# Patient Record
Sex: Female | Born: 1950 | Race: White | Hispanic: No | Marital: Married
Health system: Southern US, Community
[De-identification: ages and names within clinical notes are randomized; demographics above are authoritative.]

---

## 2004-03-18 ENCOUNTER — Ambulatory Visit: Payer: Self-pay | Admitting: Anesthesiology

## 2004-04-12 ENCOUNTER — Ambulatory Visit: Payer: Self-pay | Admitting: Anesthesiology

## 2004-05-19 ENCOUNTER — Ambulatory Visit: Payer: Self-pay | Admitting: Anesthesiology

## 2004-07-01 ENCOUNTER — Ambulatory Visit: Payer: Self-pay | Admitting: Anesthesiology

## 2004-07-26 ENCOUNTER — Ambulatory Visit: Payer: Self-pay | Admitting: Anesthesiology

## 2004-08-19 ENCOUNTER — Ambulatory Visit: Payer: Self-pay | Admitting: Anesthesiology

## 2004-09-30 ENCOUNTER — Ambulatory Visit: Payer: Self-pay | Admitting: Anesthesiology

## 2004-12-29 ENCOUNTER — Other Ambulatory Visit: Payer: Self-pay

## 2004-12-29 ENCOUNTER — Emergency Department: Payer: Self-pay | Admitting: Emergency Medicine

## 2006-11-14 ENCOUNTER — Ambulatory Visit: Payer: Self-pay | Admitting: Family Medicine

## 2007-10-08 ENCOUNTER — Emergency Department: Payer: Self-pay | Admitting: Emergency Medicine

## 2007-12-03 ENCOUNTER — Ambulatory Visit: Payer: Self-pay | Admitting: Family Medicine

## 2008-12-08 ENCOUNTER — Ambulatory Visit: Payer: Self-pay | Admitting: Family Medicine

## 2009-12-10 ENCOUNTER — Ambulatory Visit: Payer: Self-pay | Admitting: Family Medicine

## 2010-03-12 ENCOUNTER — Ambulatory Visit: Payer: Self-pay | Admitting: Internal Medicine

## 2011-06-24 ENCOUNTER — Inpatient Hospital Stay: Payer: Self-pay | Admitting: Internal Medicine

## 2011-06-24 LAB — URINALYSIS, COMPLETE
Bacteria: NONE SEEN
Bilirubin,UR: NEGATIVE
Blood: NEGATIVE
Glucose,UR: >=500 mg/dL (ref 0–75)
Leukocyte Esterase: NEGATIVE
Nitrite: NEGATIVE
RBC,UR: 1 /HPF (ref 0–5)
Squamous Epithelial: 1
WBC UR: 1 /HPF (ref 0–5)

## 2011-06-24 LAB — COMPREHENSIVE METABOLIC PANEL
Calcium, Total: 9.9 mg/dL (ref 8.5–10.1)
Chloride: 106 mmol/L (ref 98–107)
Creatinine: 1.48 mg/dL — ABNORMAL HIGH (ref 0.60–1.30)
EGFR (African American): 46 — ABNORMAL LOW
EGFR (Non-African Amer.): 38 — ABNORMAL LOW
Osmolality: 315 (ref 275–301)
Potassium: 3.6 mmol/L (ref 3.5–5.1)
SGOT(AST): 21 U/L (ref 15–37)
Sodium: 143 mmol/L (ref 136–145)
Total Protein: 8.2 g/dL (ref 6.4–8.2)

## 2011-06-24 LAB — BASIC METABOLIC PANEL
Anion Gap: 11 (ref 7–16)
BUN: 27 mg/dL — ABNORMAL HIGH (ref 7–18)
Co2: 24 mmol/L (ref 21–32)
Creatinine: 1.29 mg/dL (ref 0.60–1.30)
EGFR (African American): 54 — ABNORMAL LOW
EGFR (Non-African Amer.): 45 — ABNORMAL LOW
Glucose: 343 mg/dL — ABNORMAL HIGH (ref 65–99)
Potassium: 3.6 mmol/L (ref 3.5–5.1)
Sodium: 147 mmol/L — ABNORMAL HIGH (ref 136–145)

## 2011-06-24 LAB — CBC
HCT: 43 % (ref 35.0–47.0)
HGB: 14.9 g/dL (ref 12.0–16.0)
MCHC: 34.6 g/dL (ref 32.0–36.0)
MCV: 88 fL (ref 80–100)
Platelet: 301 10*3/uL (ref 150–440)
RBC: 4.87 10*6/uL (ref 3.80–5.20)

## 2011-06-24 LAB — DRUG SCREEN, URINE
Benzodiazepine, Ur Scrn: NEGATIVE (ref ?–200)
Cannabinoid 50 Ng, Ur ~~LOC~~: NEGATIVE (ref ?–50)
MDMA (Ecstasy)Ur Screen: NEGATIVE (ref ?–500)
Opiate, Ur Screen: NEGATIVE (ref ?–300)
Phencyclidine (PCP) Ur S: NEGATIVE (ref ?–25)

## 2011-06-24 LAB — TROPONIN I: Troponin-I: <0.02 ng/mL

## 2011-06-24 LAB — CK TOTAL AND CKMB (NOT AT ARMC)
CK, Total: 289 U/L — ABNORMAL HIGH (ref 21–215)
CK-MB: 1.2 ng/mL (ref 0.5–3.6)

## 2011-06-25 LAB — CBC WITH DIFFERENTIAL/PLATELET
Eosinophil %: 0.4 %
HCT: 39.1 % (ref 35.0–47.0)
HGB: 13.2 g/dL (ref 12.0–16.0)
Lymphocyte #: 3.9 10*3/uL — ABNORMAL HIGH (ref 1.0–3.6)
MCH: 30.1 pg (ref 26.0–34.0)
MCV: 90 fL (ref 80–100)
Monocyte #: 0.8 10*3/uL — ABNORMAL HIGH (ref 0.0–0.7)
Neutrophil #: 7.7 10*3/uL — ABNORMAL HIGH (ref 1.4–6.5)
Platelet: 260 10*3/uL (ref 150–440)
RBC: 4.37 10*6/uL (ref 3.80–5.20)
RDW: 12.8 % (ref 11.5–14.5)
WBC: 12.4 10*3/uL — ABNORMAL HIGH (ref 3.6–11.0)

## 2011-06-25 LAB — BASIC METABOLIC PANEL
Anion Gap: 10 (ref 7–16)
Anion Gap: 10 (ref 7–16)
Calcium, Total: 8.9 mg/dL (ref 8.5–10.1)
Calcium, Total: 8.7 mg/dL (ref 8.5–10.1)
Chloride: 115 mmol/L — ABNORMAL HIGH (ref 98–107)
Creatinine: 1.08 mg/dL (ref 0.60–1.30)
Creatinine: 0.99 mg/dL (ref 0.60–1.30)
EGFR (African American): >60
EGFR (African American): >60
EGFR (Non-African Amer.): 55 — ABNORMAL LOW
EGFR (Non-African Amer.): >60
Glucose: 140 mg/dL — ABNORMAL HIGH (ref 65–99)
Glucose: 146 mg/dL — ABNORMAL HIGH (ref 65–99)
Osmolality: 308 (ref 275–301)
Potassium: 3.4 mmol/L — ABNORMAL LOW (ref 3.5–5.1)
Potassium: 3.3 mmol/L — ABNORMAL LOW (ref 3.5–5.1)
Sodium: 152 mmol/L — ABNORMAL HIGH (ref 136–145)
Sodium: 150 mmol/L — ABNORMAL HIGH (ref 136–145)

## 2011-06-25 LAB — TSH: Thyroid Stimulating Horm: 3.05 u[IU]/mL

## 2011-06-26 LAB — BASIC METABOLIC PANEL
Anion Gap: 12 (ref 7–16)
BUN: 17 mg/dL (ref 7–18)
Calcium, Total: 8.3 mg/dL — ABNORMAL LOW (ref 8.5–10.1)
Chloride: 106 mmol/L (ref 98–107)
EGFR (African American): >60
EGFR (Non-African Amer.): >60
Osmolality: 293 (ref 275–301)
Potassium: 2.8 mmol/L — ABNORMAL LOW (ref 3.5–5.1)

## 2011-06-26 LAB — WBC: WBC: 10.8 10*3/uL (ref 3.6–11.0)

## 2011-06-26 LAB — MAGNESIUM: Magnesium: 1.7 mg/dL — ABNORMAL LOW

## 2011-06-27 LAB — POTASSIUM: Potassium: 3.4 mmol/L — ABNORMAL LOW (ref 3.5–5.1)

## 2011-06-28 LAB — HEMOGLOBIN: HGB: 12.6 g/dL (ref 12.0–16.0)

## 2011-06-28 LAB — BASIC METABOLIC PANEL
Anion Gap: 10 (ref 7–16)
BUN: 13 mg/dL (ref 7–18)
Calcium, Total: 9.2 mg/dL (ref 8.5–10.1)
Chloride: 106 mmol/L (ref 98–107)
Co2: 24 mmol/L (ref 21–32)
Osmolality: 285 (ref 275–301)
Potassium: 3.7 mmol/L (ref 3.5–5.1)

## 2011-06-28 LAB — MAGNESIUM: Magnesium: 1.8 mg/dL

## 2011-06-30 LAB — CULTURE, BLOOD (SINGLE)

## 2011-07-08 ENCOUNTER — Inpatient Hospital Stay: Payer: Self-pay | Admitting: Internal Medicine

## 2011-07-08 LAB — URINALYSIS, COMPLETE
Bacteria: NONE SEEN
Bilirubin,UR: NEGATIVE
Blood: NEGATIVE
Glucose,UR: NEGATIVE mg/dL (ref 0–75)
Ph: 7 (ref 4.5–8.0)
Protein: NEGATIVE
RBC,UR: 2 /HPF (ref 0–5)
Squamous Epithelial: 1

## 2011-07-08 LAB — BASIC METABOLIC PANEL
Anion Gap: 14 (ref 7–16)
BUN: 13 mg/dL (ref 7–18)
Calcium, Total: 9.3 mg/dL (ref 8.5–10.1)
Chloride: 108 mmol/L — ABNORMAL HIGH (ref 98–107)
Creatinine: 1.14 mg/dL (ref 0.60–1.30)
EGFR (African American): >60
EGFR (Non-African Amer.): 52 — ABNORMAL LOW
Glucose: 122 mg/dL — ABNORMAL HIGH (ref 65–99)
Osmolality: 286 (ref 275–301)
Potassium: 3.3 mmol/L — ABNORMAL LOW (ref 3.5–5.1)
Sodium: 143 mmol/L (ref 136–145)

## 2011-07-08 LAB — RAPID INFLUENZA A&B ANTIGENS

## 2011-07-08 LAB — CBC
HGB: 13 g/dL (ref 12.0–16.0)
MCH: 30 pg (ref 26.0–34.0)
MCV: 88 fL (ref 80–100)
RBC: 4.32 10*6/uL (ref 3.80–5.20)
WBC: 10.8 10*3/uL (ref 3.6–11.0)

## 2011-07-09 LAB — CBC WITH DIFFERENTIAL/PLATELET
Basophil #: 0.1 10*3/uL (ref 0.0–0.1)
Basophil %: 0.7 %
Eosinophil #: 0.2 10*3/uL (ref 0.0–0.7)
Eosinophil %: 2.5 %
HGB: 12.4 g/dL (ref 12.0–16.0)
Lymphocyte #: 3.1 10*3/uL (ref 1.0–3.6)
Lymphocyte %: 32.5 %
MCH: 30 pg (ref 26.0–34.0)
MCHC: 33.7 g/dL (ref 32.0–36.0)
Monocyte %: 7.3 %
Neutrophil #: 5.4 10*3/uL (ref 1.4–6.5)
Neutrophil %: 57 %
RDW: 13.3 % (ref 11.5–14.5)
WBC: 9.5 10*3/uL (ref 3.6–11.0)

## 2011-07-09 LAB — BASIC METABOLIC PANEL
Anion Gap: 11 (ref 7–16)
BUN: 14 mg/dL (ref 7–18)
Calcium, Total: 8.8 mg/dL (ref 8.5–10.1)
Chloride: 111 mmol/L — ABNORMAL HIGH (ref 98–107)
Creatinine: 1.19 mg/dL (ref 0.60–1.30)
EGFR (African American): 60 — ABNORMAL LOW
EGFR (Non-African Amer.): 49 — ABNORMAL LOW
Glucose: 86 mg/dL (ref 65–99)
Osmolality: 290 (ref 275–301)
Sodium: 146 mmol/L — ABNORMAL HIGH (ref 136–145)

## 2011-07-10 LAB — URINE CULTURE

## 2011-07-13 LAB — CULTURE, BLOOD (SINGLE)

## 2011-07-13 LAB — CREATININE, SERUM
Creatinine: 0.86 mg/dL (ref 0.60–1.30)
EGFR (Non-African Amer.): >60

## 2011-08-10 ENCOUNTER — Observation Stay: Payer: Self-pay | Admitting: Internal Medicine

## 2011-08-10 LAB — COMPREHENSIVE METABOLIC PANEL
Anion Gap: 14 (ref 7–16)
BUN: 13 mg/dL (ref 7–18)
Bilirubin,Total: 0.3 mg/dL (ref 0.2–1.0)
Creatinine: 1.08 mg/dL (ref 0.60–1.30)
EGFR (African American): >60
Potassium: 3.2 mmol/L — ABNORMAL LOW (ref 3.5–5.1)
SGOT(AST): 31 U/L (ref 15–37)
SGPT (ALT): 40 U/L
Total Protein: 7.3 g/dL (ref 6.4–8.2)

## 2011-08-10 LAB — CBC
MCH: 29.9 pg (ref 26.0–34.0)
MCV: 88 fL (ref 80–100)
Platelet: 286 10*3/uL (ref 150–440)
RDW: 13 % (ref 11.5–14.5)
WBC: 11.4 10*3/uL — ABNORMAL HIGH (ref 3.6–11.0)

## 2011-08-10 LAB — TROPONIN I: Troponin-I: <0.02 ng/mL

## 2012-06-24 ENCOUNTER — Emergency Department: Payer: Self-pay | Admitting: Internal Medicine

## 2012-06-24 LAB — COMPREHENSIVE METABOLIC PANEL
Albumin: 3.4 g/dL (ref 3.4–5.0)
Alkaline Phosphatase: 39 U/L — ABNORMAL LOW (ref 50–136)
Anion Gap: 12 (ref 7–16)
Bilirubin,Total: 0.3 mg/dL (ref 0.2–1.0)
Calcium, Total: 8.8 mg/dL (ref 8.5–10.1)
Chloride: 103 mmol/L (ref 98–107)
Co2: 23 mmol/L (ref 21–32)
EGFR (African American): >60
Glucose: 163 mg/dL — ABNORMAL HIGH (ref 65–99)
Osmolality: 282 (ref 275–301)
Potassium: 3.4 mmol/L — ABNORMAL LOW (ref 3.5–5.1)
Total Protein: 7.3 g/dL (ref 6.4–8.2)

## 2012-06-24 LAB — URINALYSIS, COMPLETE
Bilirubin,UR: NEGATIVE
Blood: NEGATIVE
Glucose,UR: NEGATIVE mg/dL (ref 0–75)
Ketone: NEGATIVE
Nitrite: NEGATIVE
Protein: 30
Squamous Epithelial: 10
WBC UR: 226 /HPF (ref 0–5)

## 2012-06-24 LAB — CBC
HCT: 35.8 % (ref 35.0–47.0)
MCH: 26.9 pg (ref 26.0–34.0)
MCV: 84 fL (ref 80–100)
Platelet: 331 10*3/uL (ref 150–440)
RBC: 4.25 10*6/uL (ref 3.80–5.20)
RDW: 13 % (ref 11.5–14.5)

## 2012-06-26 LAB — URINE CULTURE

## 2012-06-30 LAB — CULTURE, BLOOD (SINGLE)

## 2012-07-17 IMAGING — CT CT HEAD WITHOUT CONTRAST
2 series · 15 of 30 positions shown, 19 images · non-contrast
Comparison: none

REASON FOR EXAM: new onset seizure
COMMENTS:

[Series 2: without · axial · non-contrast · 0.39mm/px · z∈[+410,+540]mm · 13 of 39 slices shown, 17 images]
[im 3/39  brain]
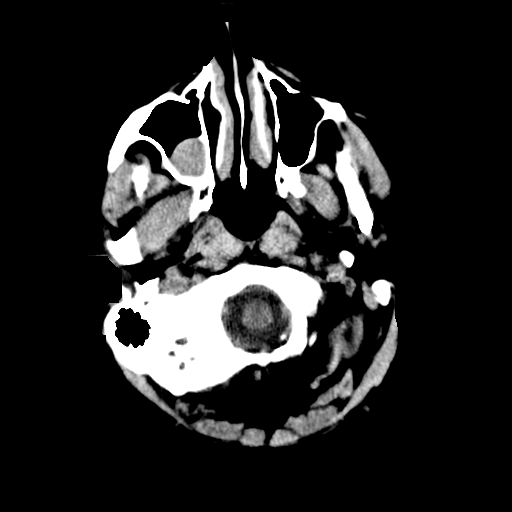
[im 3/39  bone]
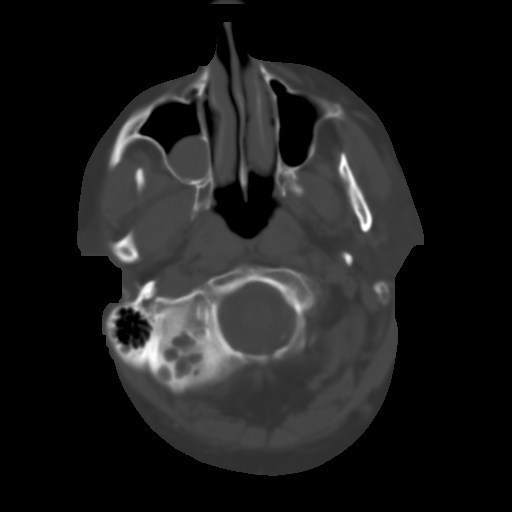
[im 6/39  brain]
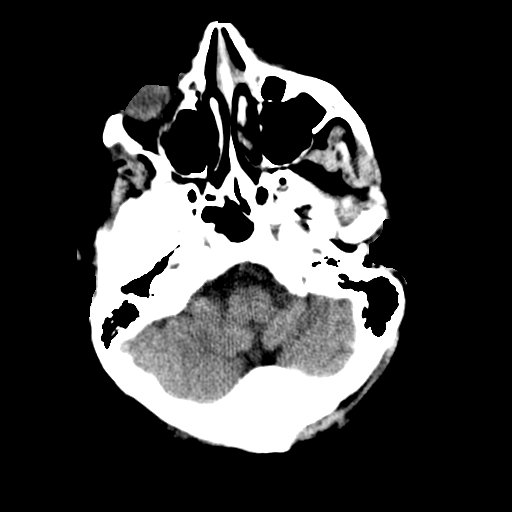
[im 9/39  brain]
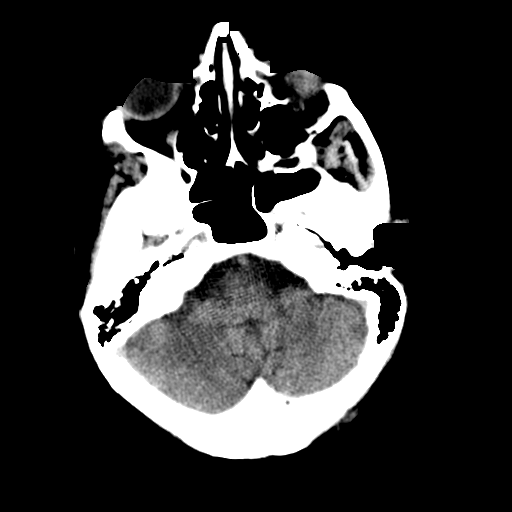
[im 11/39  brain]
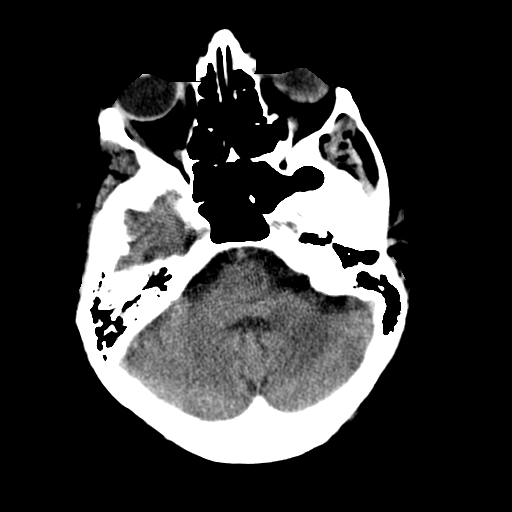
[im 14/39  brain]
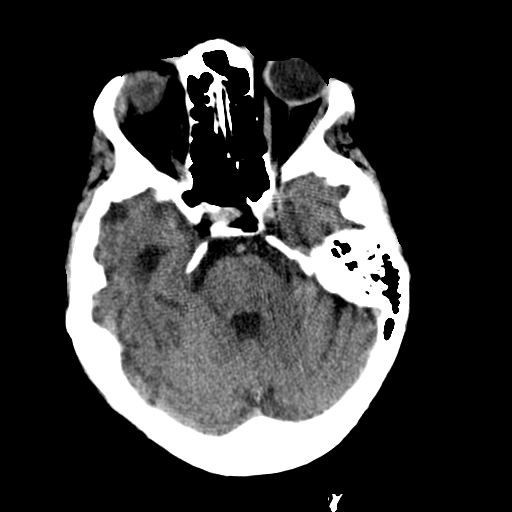
[im 14/39  bone]
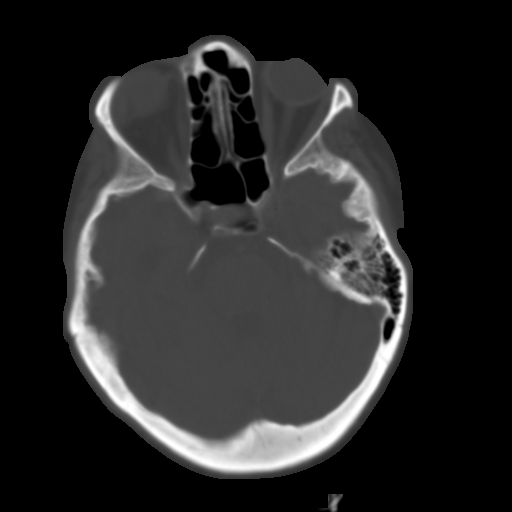
[im 17/39  brain]
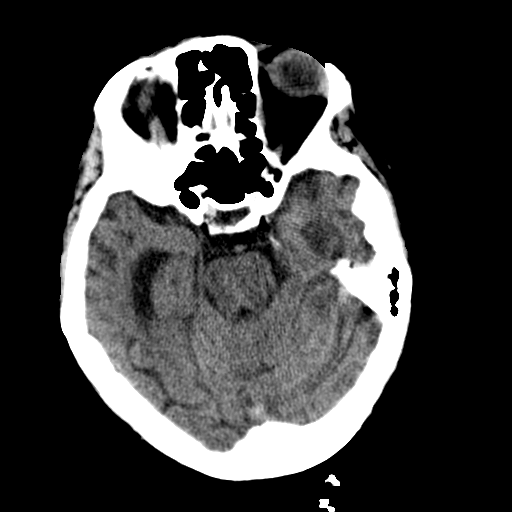
[im 20/39  brain]
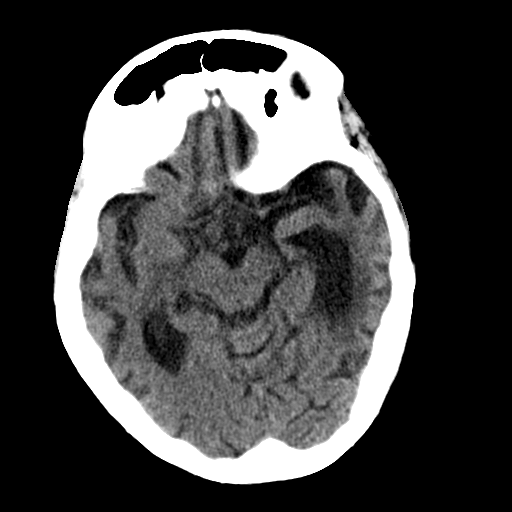
[im 22/39  brain]
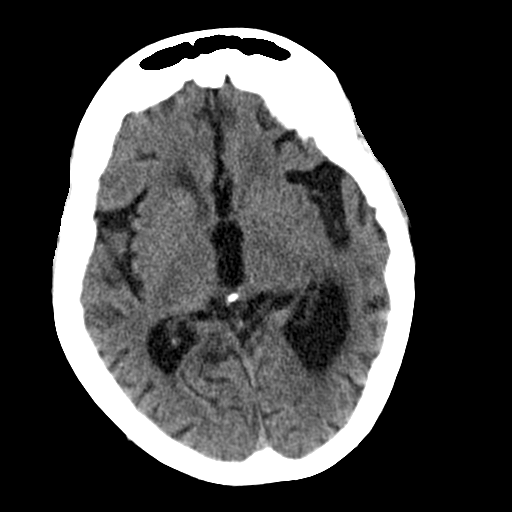
[im 25/39  brain]
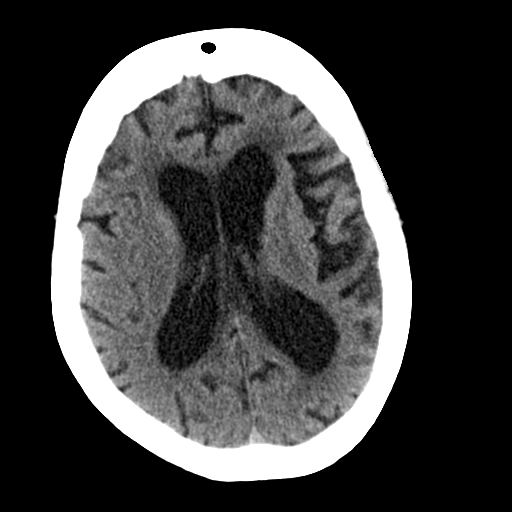
[im 25/39  bone]
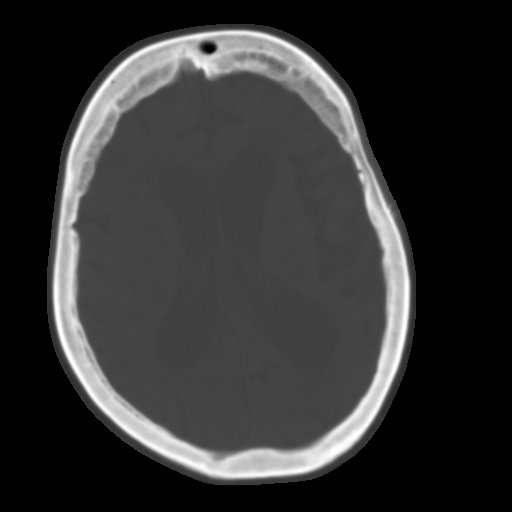
[im 28/39  brain]
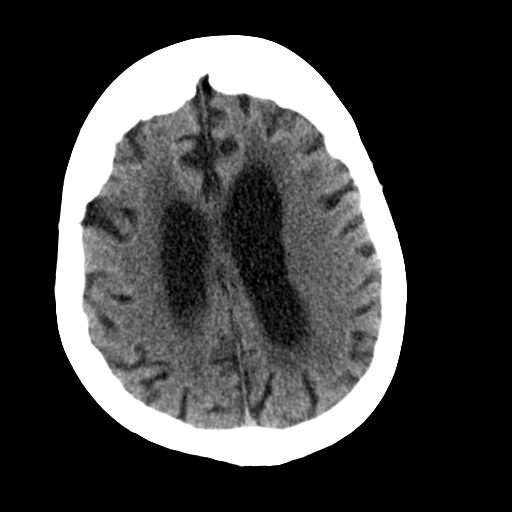
[im 30/39  brain]
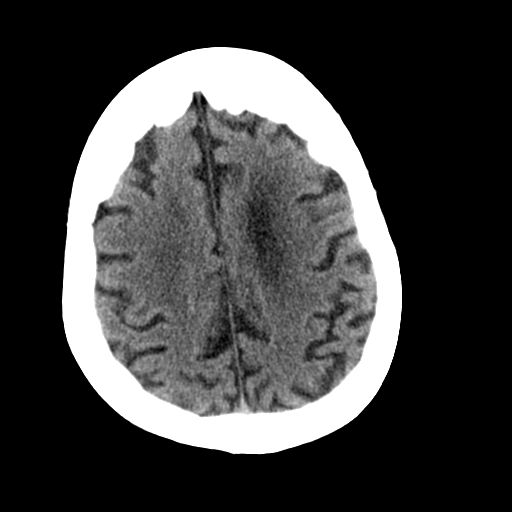
[im 33/39  brain]
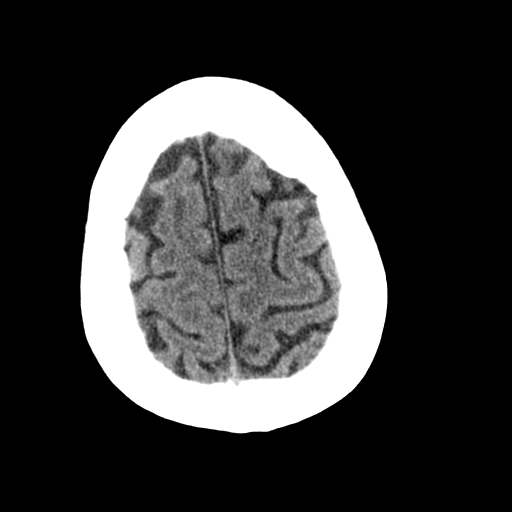
[im 36/39  brain]
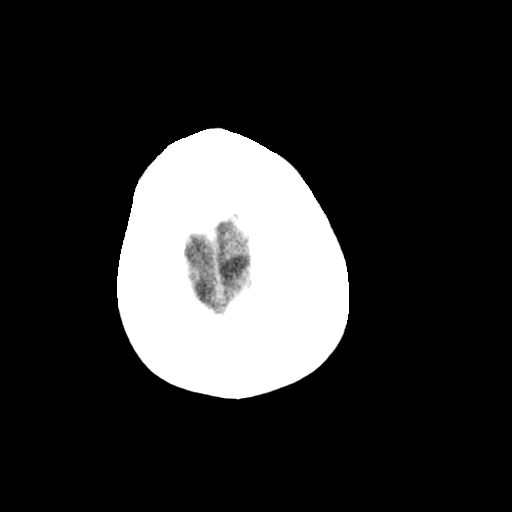
[im 36/39  bone]
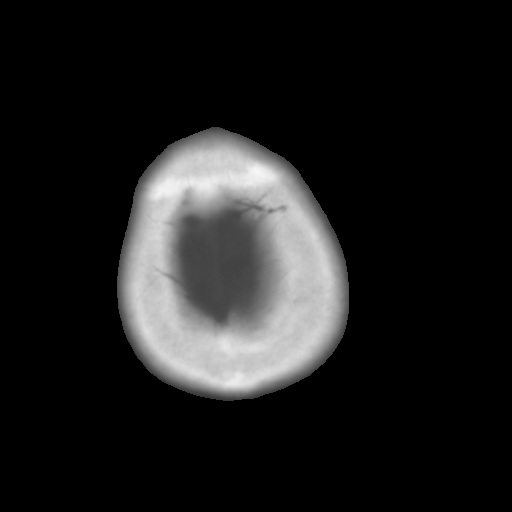

[Series 3: bone · axial · 0.39mm/px · z∈[+410,+430]mm · 2 of 39 slices shown]
[im 3/39  bone]
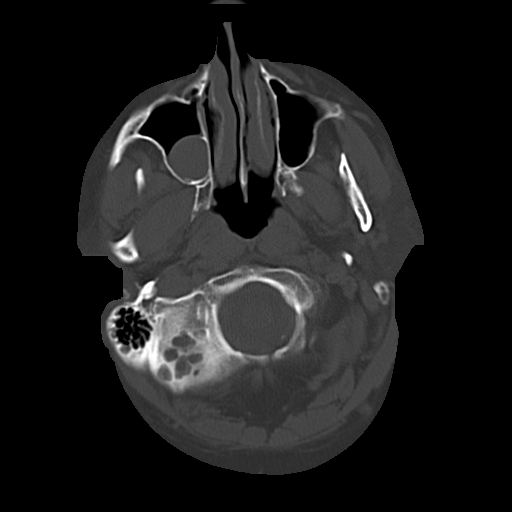
[im 9/39  bone]
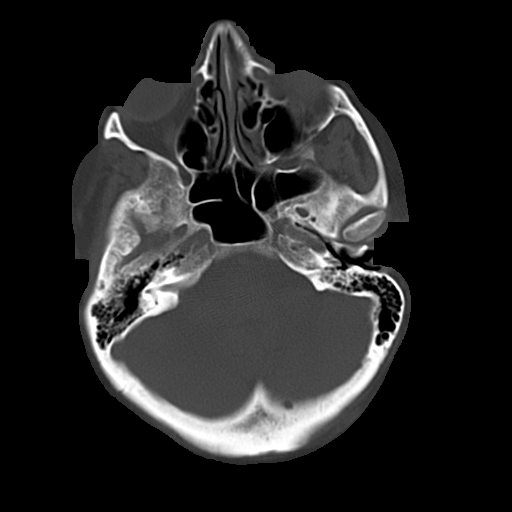

[15 of 30 positions shown; findings below may reference images not displayed]

PROCEDURE:     CT  - CT HEAD WITHOUT CONTRAST  - August 10, 2011  [DATE]

RESULT:     Axial noncontrast CT scanning was performed through the brain
with reconstructions at 5 mm intervals and slice thicknesses. Comparison
made to study June 24, 2011.

There is mild diffuse cerebral atrophy with compensatory ventriculomegaly.
These findings are stable. There is no shift of the midline. There is no
intracranial hemorrhage. There is no evidence of an evolving ischemic
infarction. The cerebellum and brainstem are normal in density.

At bone window settings there is likely a retention cyst or polyp in the
posterior aspect of the right maxillary sinus. This is stable. I see no
acute skull fracture.
IMPRESSION: There are chronic findings within the brain which appear
stable. I do not see evidence of an acute ischemic or hemorrhagic event nor
intracranial mass effect. Overall there has not been significant interval
change since the earlier study.

A preliminary report was sent to the [HOSPITAL] the conclusion
of the study.

## 2012-09-11 DEATH — deceased

## 2013-06-01 IMAGING — CT CT HEAD WITHOUT CONTRAST
1 of 2 series · 13 of 30 positions shown, 17 images · non-contrast
Comparison: none

REASON FOR EXAM: seizure
COMMENTS:

[Series 4: soft tissue · axial · 0.42mm/px · z∈[-169,-39]mm · 13 of 32 slices shown, 17 images]
[im 3/32  brain]
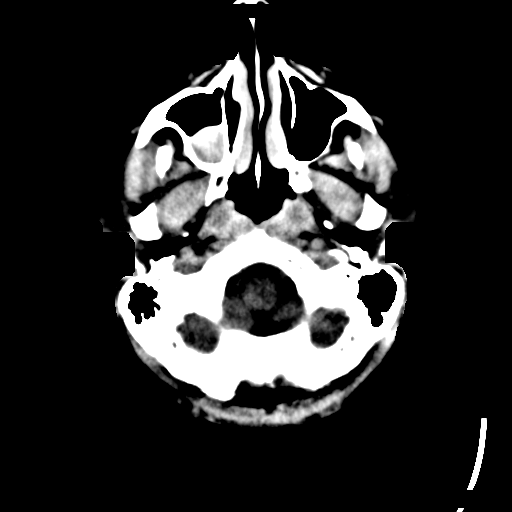
[im 3/32  bone]
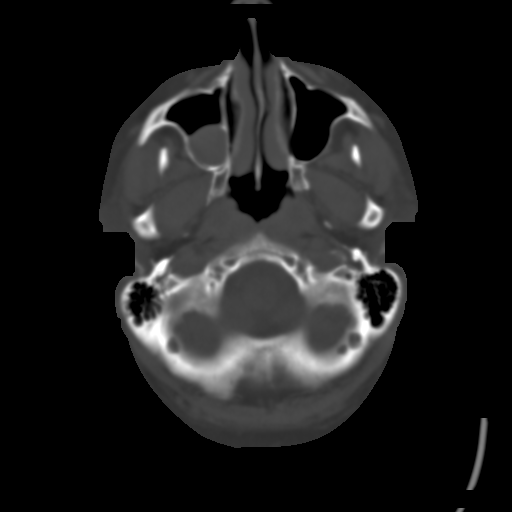
[im 5/32  brain]
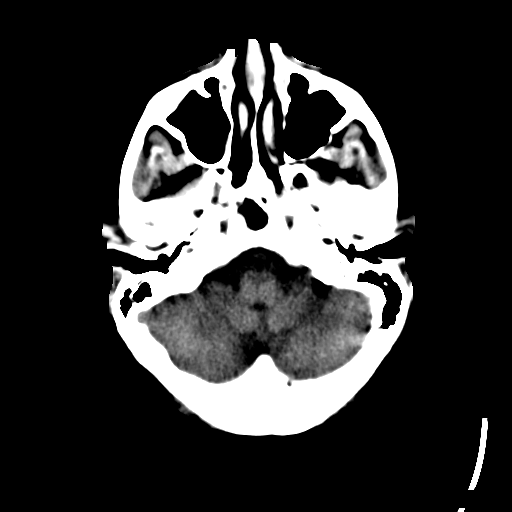
[im 7/32  brain]
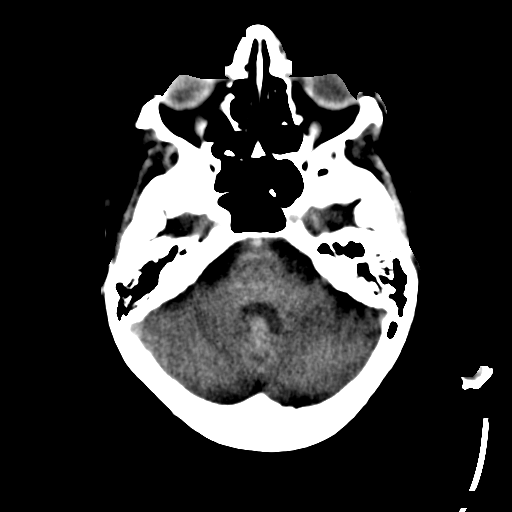
[im 9/32  brain]
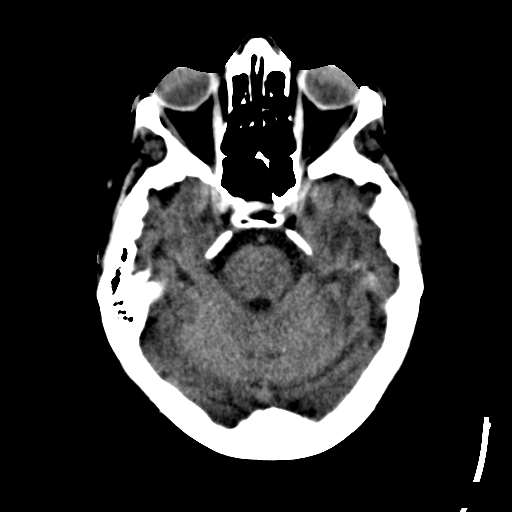
[im 12/32  brain]
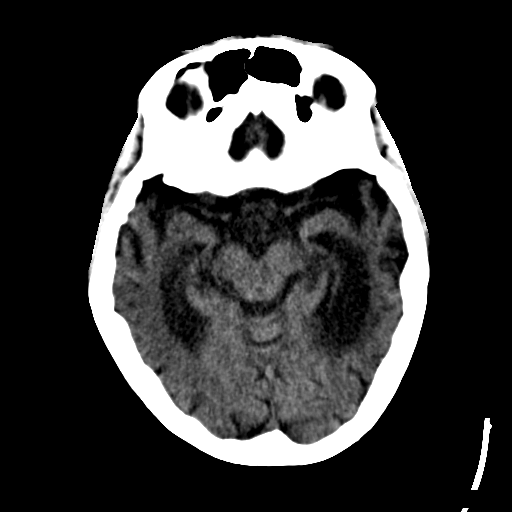
[im 12/32  bone]
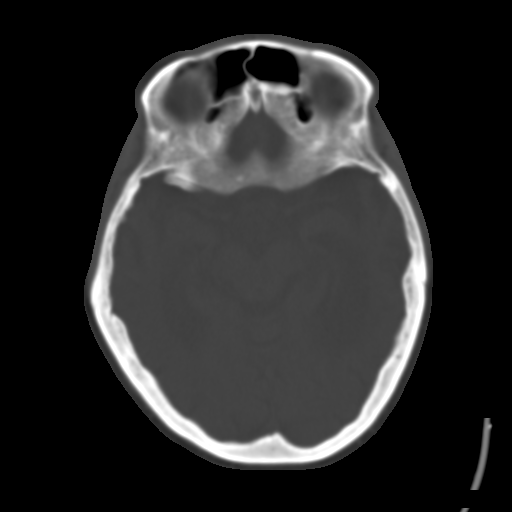
[im 14/32  brain]
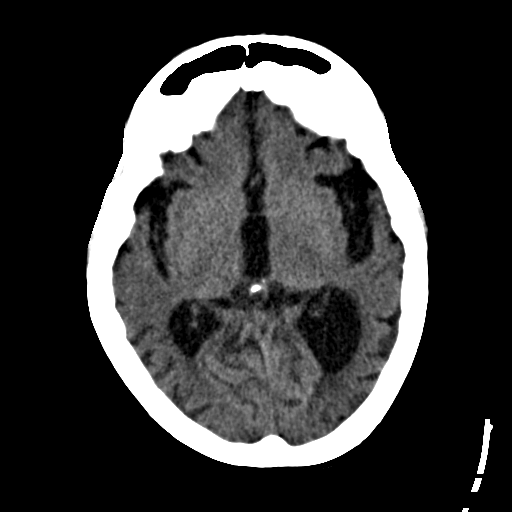
[im 16/32  brain]
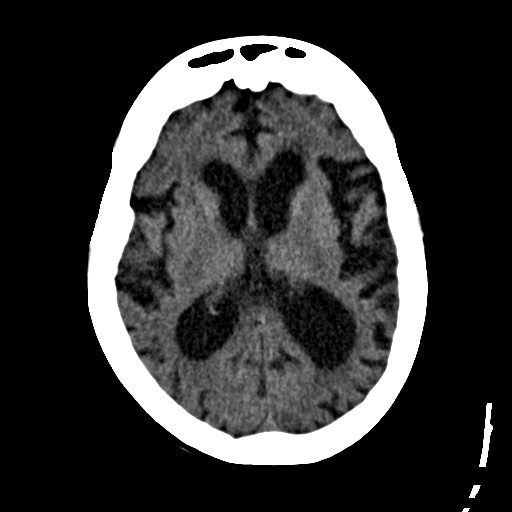
[im 18/32  brain]
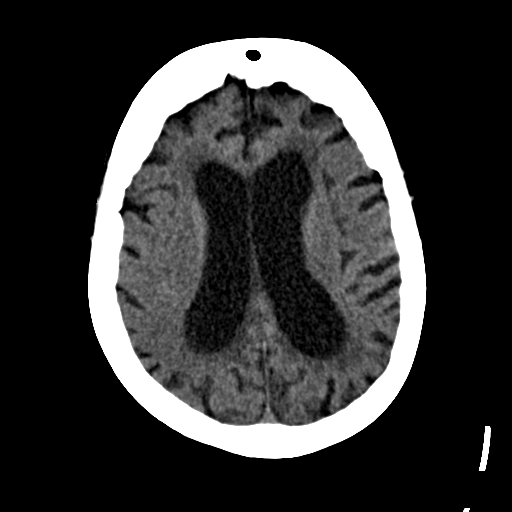
[im 20/32  brain]
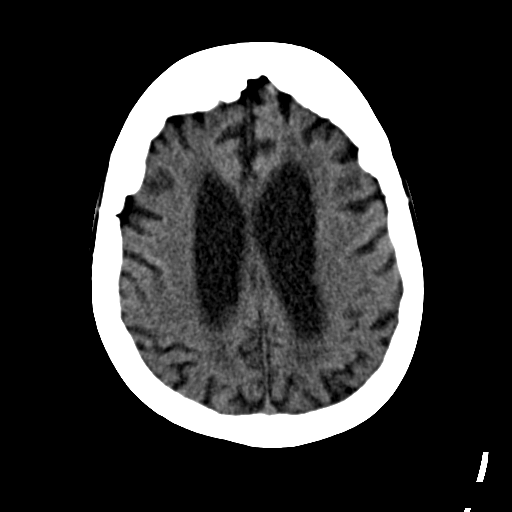
[im 20/32  bone]
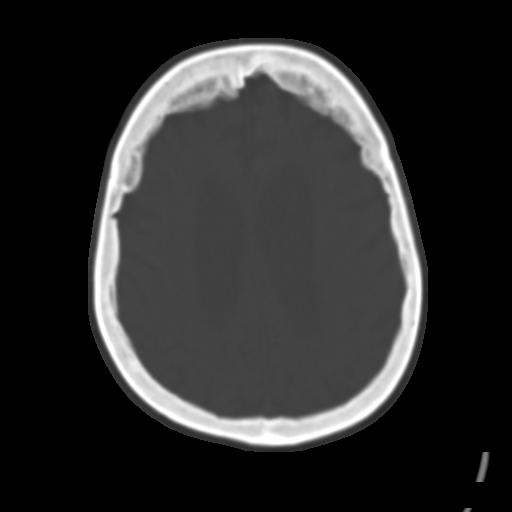
[im 23/32  brain]
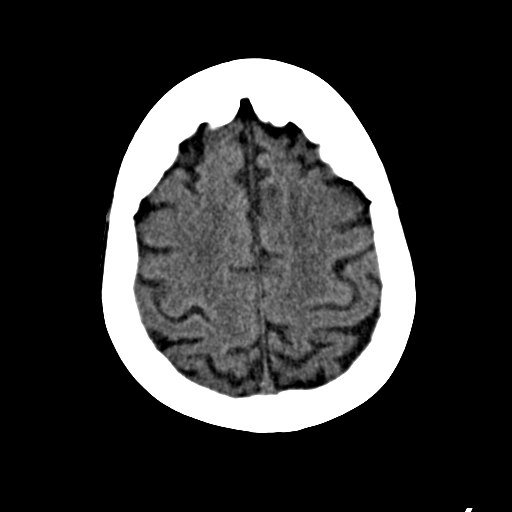
[im 25/32  brain]
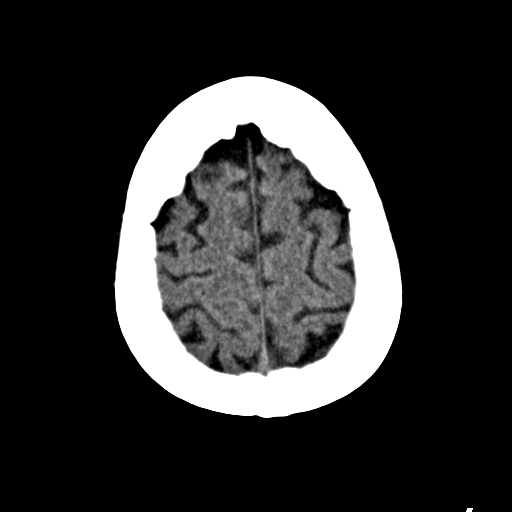
[im 27/32  brain]
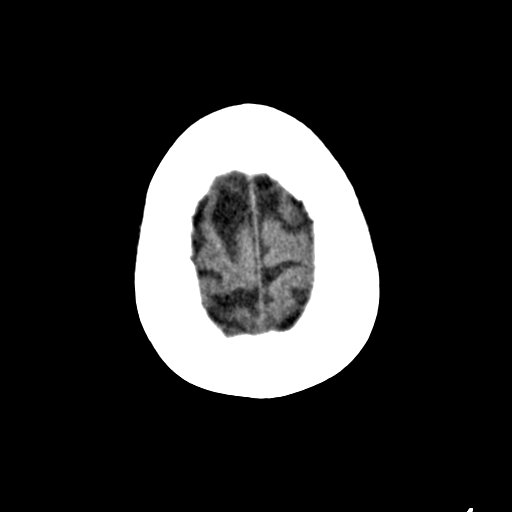
[im 29/32  brain]
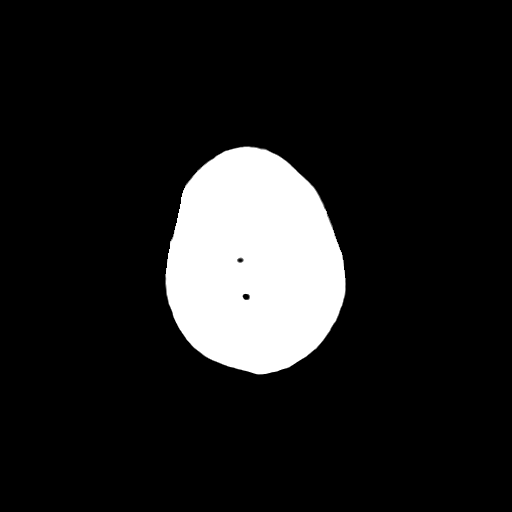
[im 29/32  bone]
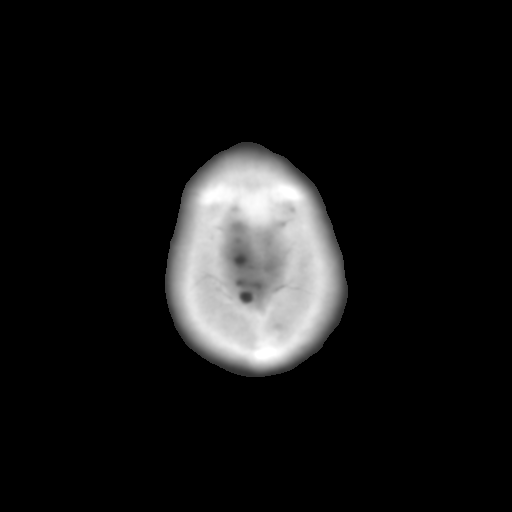

[13 of 30 positions shown; findings below may reference images not displayed]

PROCEDURE:     CT  - CT HEAD WITHOUT CONTRAST  - June 24, 2012  [DATE]

RESULT:     Axial CT scanning was performed through the brain with
reconstructions at 5 mm intervals and slice thicknesses. Comparison is made
to a study dated August 10, 2011.

There is moderate diffuse cerebral and cerebellar atrophy with compensatory
ventriculomegaly. There is no intracranial hemorrhage nor intracranial mass
effect. There is no evidence of an evolving ischemic infarction. There are
no abnormal intracranial calcifications. The cerebellum and brainstem are
normal in density. At bone window settings the observed portions of the
paranasal sinuses exhibit no air-fluid levels. There is a stable retention
cyst or polyp posteriorly in the right maxillary sinus.
IMPRESSION: 1. There is moderate diffuse cerebral and cerebellar atrophy with
compensatory ventriculomegaly which appears stable since [REDACTED] and
July 2011 CT scans.
2. There is no evidence of an acute ischemic or hemorrhagic infarction.
3. There is a stable retention cyst or polyp in the right maxillary sinus.

[REDACTED]

## 2014-10-05 NOTE — H&P (Signed)
PATIENT NAME:  Krystal GaulMARTIN, Krystal Ruiz MR#:  [id number] DATE OF BIRTH:  1950/09/13  DATE OF ADMISSION:  06/24/2011  ADDENDUM   I discussed the case with Dr. Leavy CellaBlocker, infection disease specialist on call, and consider that we have a low suspicion for meningitis and we have another cause of her symptoms being new onset diabetes, hyperglycemic, hyperosmolar state, with dehydration, she has no photophobia at this time. There is no flexion of her legs on neck flexion, so we are not going to continue antibiotics on her or meningitis precautions on her at this time. We will treat her diabetes aggressively, replace her fluids and if she gets better, then this is not meningitis. If there is any worsening of the situation, then we may have to consider restarting the antibiotic and ID can be consulted. But at this time, will not continue the antibiotics. She does not look like a bacterial meningitis. .   ____________________________ Fredia SorrowAbhinav Cierah Crader, MD ag:ap D: 06/24/2011 16:27:30 ET T: 06/24/2011 16:51:39 ET JOB#: [id number]  cc: Fredia SorrowAbhinav Yuval Nolet, MD, <Dictator> Heidi Dachebecca O. Darliss Cheneyrendorff, MD Fredia SorrowABHINAV Jenayah Antu MD ELECTRONICALLY SIGNED 07/08/2011 10:59

## 2014-10-05 NOTE — Discharge Summary (Signed)
PATIENT NAME:  Krystal Ruiz, Krystal Ruiz MR#:  [id number] DATE OF BIRTH:  10/11/1950  DATE OF ADMISSION:  06/24/2011 DATE OF DISCHARGE:  06/30/2011  ADMITTING DIAGNOSIS: Diabetes mellitus.    DISCHARGE DIAGNOSES:  1. Diabetes mellitus, type II, insulin dependent now with hemoglobin A1c 11.1 presenting with severe hyperglycemia as well as hyperosmolar state.  2. Acute renal failure, resolved.  3. Hypernatremia due to dehydration, resolved with IV fluids.  4. Dehydration.  5. Leukocytosis, likely stress related, resolved.  6. Hypokalemia and hypomagnesemia, resolved.  7. Generalized weakness, likely current disease/illness related, improving with physical therapy.  8. History of frontotemporal dementia.  9. History of hypothyroidism. 10. History of hypertension. 11. History of hyperlipidemia.  12. Obesity.   DISCHARGE CONDITION: Stable.   DISCHARGE MEDICATIONS: The patient is to resume her outpatient medications which are:  1. Aspirin 81 mg p.o. daily.  2. Cholecalciferol 2000 units p.o. daily.  3. Donepezil 10 mg p.o. at bedtime.  4. Glucosamine 1 tablet once daily. 5. Levothyroxine 25 mcg p.o. daily.  6. Memantine 10 mg p.o. twice daily.  7. Nystatin 100,000 units topical cream one application 2 or 3 times daily as needed.  8. Omega-3 polyunsaturated fatty acids 1 gram once every second day.  9. Paroxetine 20 mg p.o. daily.  10. Tramadol 50 mg 1 to 2 tablets every six hours as needed.  11. Rizatriptan 10 mg p.o. as needed for headache. May repeat every two hours until maximum of three tablets in 24 hours.  12. Multivitamins once daily.   ADDITIONAL MEDICATIONS:  1. Tylenol 350 mg p.o. every four hours as needed.  2. Glulisine sliding scale.  3. Glulisine 8 units 3 times daily subcutaneously with meals.  4. Magnesium oxide 400 mg p.o. daily.  5. Glucophage 1 gram p.o. twice daily.  6. Insulin Lantus 70 units subcutaneously daily.   DO NOT TAKE: The patient is not to take  lisinopril/HCTZ until recommended by primary care physician or facility MD.   DIET: 2 gram salt, 1800 ADA, mechanical soft, low fat, low cholesterol diet.   PHYSICAL ACTIVITY LIMITATIONS: As tolerated.   REFERRALS:  1. Physical therapy 2 to 7 times a week.  2. Also, the patient and husband should undergo diabetic education.  FOLLOW-UP: Follow-up appointment with Dr. Darliss Cheneyrendorff in two days after discharge.  CONSULTANTS: None.  RADIOLOGIC STUDIES:  1. Chest, portable, single view, 06/24/2011 no acute changes identified.  2. CT of head without contrast 06/24/2011 no CT evidence of acute intracranial abnormality. Changes of atrophy and chronic small vessel ischemic disease are present. 3. CT of cervical spine without contrast 06/24/2011 revealed no acute cervical spine bony abnormality.   REASON FOR ADMISSION: The patient is a 64 year old Caucasian female with past medical history significant for history of dementia who presented the hospital after falls. Apparently the patient has been falling a lot over the past one week prior to coming to the hospital. She was also having excessive polyuria. She was noted to have low-grade fevers at home apparently but fever was not documented in triage. On arrival to the Emergency Room, the patient was noted to be hyperglycemic in acute renal failure, severely dehydrated, and the patient was admitted to the hospital for further evaluation.   LABORATORY DATA: Glucose 516, BUN and creatinine 32 and 1.48. BMP was otherwise unremarkable. Estimated GFR for non-African American would be 38. Hemoglobin A1c was checked and was found to be 11.1. The patient's liver enzymes were unremarkable. The patient's cardiac enzymes were unremarkable. TSH  normal at 3.05. Urine drug screen was unremarkable. The patient's white blood cell count was slightly elevated to 11.1, hemoglobin 14.9, platelets 301. Blood cultures taken on 06/24/2011 showed no growth. Urine culture also showed  no growth. Influenza test was negative. Urinalysis was remarkable only for glucosuria, otherwise unremarkable. The patient had venous pH checked and was found to be 7.44. PCO2 was low at 32. EKG revealed sinus rhythm at 85 beats per minute, normal axis, prolonged QTc to 504 ms and changes concerning for possible anterior infarct, age undetermined. No acute ST-T changes, however, were noted. The patient's chest x-ray was unremarkable as well as her CT of head and neck.   HOSPITAL COURSE: The patient was admitted to the hospital for further evaluation. Initially she was admitted to Intensive Care Unit for IV insulin drip. The patient was continued on IV insulin drip and then later switched to insulin Lantus. She was rehydrated as well. However, with rehydration she was noted to have elevated sodium level to 152 on 06/25/2011. Her IV fluids where changed and she was allowed to drink free water. With this therapy, she improved and her sodium level normalized on 06/26/2011. Her diabetic medications were advanced introducing high doses of insulin as well as metformin. On day of discharge, the patient's blood glucose levels were ranging from 122 to 185, staying around 150. The patient's diabetic medications will be advanced tonight even higher to 70 units subcutaneously daily of Lantus as well as 1 gram of metformin twice a day. The patient as well as her husband is to receive diabetic education and lifestyle management as well as dietary education.   While in the hospital, the patient was also, as mentioned above, noted to be not only hypernatremic and very dehydrated but also had very low potassium levels. With rehydration, the patient's potassium level dropped down as low as 2.8 on 06/26/2011. Her magnesium level was also low at 1.7. The patient received potassium as well as magnesium replenishment via IV as well as oral pills. By the day of discharge, 06/30/2011, the patient's potassium level was normal at 3.7.    In regards to acute renal failure, initially on arrival to the Emergency Room the patient's creatinine was elevated to 1.48. The patient was rehydrated as mentioned above and the patient's creatinine level normalized on 06/28/2011. The patient's creatinine was 0.9. It is recommended to follow the patient's creatinine levels and make decisions about medication or hydration as necessary.   The patient was noted to have mild leukocytosis while in the hospital on arrival to the Emergency Room, however, as the patient's blood cultures were negative and her chest x-ray was unremarkable. She did not exhibit anymore signs of infection and she was afebrile all her stay in the hospital time. It was felt that the patient's leukocytosis was very likely stress related. Her leukocytosis, however, resolved by the time of discharge.   The patient was noted to have severe weakness. It was felt that the patient's weakness could have been related to current illness. Physical therapist worked with her and initially the patient was not able to do much, however, as time progressed she progressively was getting better. It was felt that the patient would benefit from discharge to skilled nursing facility for rehabilitation and Social Worker was consulted. However, because of insurance issues we were not able to discharge her earlier; however, today on 06/30/2011 we were allowed to transfer the patient to skilled nursing facility for rehabilitation where she will be undergoing physical  therapy.  For history of frontotemporal dementia as well as hypothyroidism, hypertension, as well as hyperlipidemia, the patient is to continue her outpatient medications. No changes were made in regards to that.  The patient is being discharged in stable condition with above-mentioned medications and follow-up.  Of note, in regards to hypertension issues, the patient's blood pressure remained somewhat on the low side during her stay in the  hospital time and the patient's blood pressure medications were placed on hold. By day of discharge, the patient's blood pressure has improved with systolic blood pressure around 121/75 on 06/30/2011. However, because blood pressure was not high, decision was made not to give the patient ACE inhibitors at this point. However, she may need to take ACE inhibitors if her blood pressure worsens. HCTZ was discontinued because of concerns of dehydration in this patient with poorly controlled diabetes at this point.   On day of discharge, the patient felt satisfactory and did not complain of any significant discomfort. Her demeanor was really good. She was in good spirits. Her vital signs showed temperature of 97, pulse 68, respiration rate 20, blood pressure 121/75, saturation 95% on 1 liter of oxygen through nasal cannula at rest.        TIME SPENT: 40 minutes.  ____________________________ Katharina Caper, MD rv:drc D: 06/30/2011 14:51:03 ET T: 06/30/2011 15:25:31 ET JOB#: [id number]  cc: Katharina Caper, MD, <Dictator> Heidi Dach. Darliss Cheney, MD Katharina Caper MD ELECTRONICALLY SIGNED 07/11/2011 7:49

## 2014-10-05 NOTE — H&P (Signed)
PATIENT NAME:  Krystal Ruiz, Krystal Ruiz MR#:  [id number] DATE OF BIRTH:  Mar 17, 1951  DATE OF ADMISSION:  08/10/2011  PRIMARY CARE PHYSICIAN: Gardiner Rhyme, MD  CHIEF COMPLAINT: Convulsion episode.   HISTORY OF PRESENT ILLNESS: Krystal Ruiz is a 64 year old Caucasian female with history of frontal lobe dementia, history of diabetes mellitus, and chronic back pain. The patient lives at home and is cared for by her husband and her daughter. She was in her usual state of health until today, around midnight, when her husband noticed that she was in bed and she was in a convulsive state and shaking her entire body with her eyes rolling backwards. This episode lasted about 10 minutes during which there was a little blood noted in her mouth, possibly she bit her tongue. After that she was in postictal phase and then within half an hour she was back to her normal state. This is the first time she has had a convulsion. The patient was brought to the emergency department for evaluation and admitted for 24-hour observation.   REVIEW OF SYSTEMS: A 10 point system review is unobtainable due to her advanced dementia. The patient does not interact to the questions.   PAST MEDICAL HISTORY:  1. History of frontal lobe dementia.  2. History of systemic hypertension.  3. History of diabetes mellitus, type II.  4. History of hypothyroidism.  5. History of chronic back pain.  6. History of depression.   SOCIAL HABITS: Nonsmoker. No history of alcohol abuse.   SOCIAL HISTORY: She is married and living with her husband. She worked in Designer, fashion/clothing in the past.   FAMILY HISTORY: Her mother suffered from hypertension, diabetes, and stroke. Also she had dementia. Her father died from complications of chronic obstructive pulmonary disease.   ADMISSION MEDICATIONS:  1. Aspirin 81 mg a day. 2. Lisinopril with hydrochlorothiazide 20/12.5 mg once a day. 3. Glucophage 500 mg twice a day. 4. Lantus insulin 40 units at night.   5. Magnesium oxide 400 mg once a day. 6. Namenda 10 mg twice a day.  7. Aricept 10 mg once a day. 8. Omega-3 fatty acids 1000 mg once a day. 9. Paxil 20 mg day. 10. Remeron 7.5 mg 1/2 tablet of 15 mg. The dose a small because it causes her to be in excessive sleep.  11. Synthroid 25 mcg once a day. 12. Tramadol 50 mg every six hours p.r.n. for back pain.  13. Tylenol p.r.n.  14. Vitamin D 2000 international units once a day.   ALLERGIES: Reported as Darvocet.   PHYSICAL EXAMINATION:   VITAL SIGNS: Blood pressure is 111/76, respiratory rate 20, pulse 77, temperature 97.7, and oxygen saturation is 96%.   GENERAL APPEARANCE: Middle-aged female lying in bed, comfortable, in no acute distress.   HEAD AND NECK: Unremarkable. No pallor. No icterus. No cyanosis.   EARS, NOSE, AND THROAT: The patient is able to hear and she looks towards the face. Nasal mucosa looks normal. There is a little bit of crusted blood on the lower lip, on the corner. She is not opening her mouth to examine the tongue or the throat.   EYES: Normal eyelids and conjunctivae. Pupils are about 4 to 5 mm, equal and sluggishly reactive to light.   NECK: Supple. Trachea at midline. No thyromegaly. No cervical lymphadenopathy.   HEART: Normal S1 and S2. No S3 or S4. No murmur. No gallop. No carotid bruits.   RESPIRATORY: Normal breathing pattern without use of accessory muscles. No rales. No wheezing.  ABDOMEN: Soft without tenderness. No hepatosplenomegaly. No masses. No hernias.   SKIN: No ulcers. No subcutaneous nodules.   MUSCULOSKELETAL: No joint swelling. No clubbing.   NEUROLOGIC: Cranial nerves II through XII grossly intact. No focal motor deficit.   PSYCHIATRIC: Unobtainable due to the patient's lack of interaction, but she looks calm and even smiling.   LABS/STUDIES: CAT scan of the head showed no acute intracerebral abnormality. No hemorrhage. There is evidence of cerebral atrophy consistent with her  history.   LABS/STUDIES: Chest x-ray was negative.   Her blood work-up showed a glucose of 136, BUN 13, creatinine 1.08, sodium 142, and potassium 3.2. Her liver function tests were normal. Troponin was less than 0.02. CBC showed white count of 11,000, hemoglobin 13, hematocrit 38, and platelet count 286.   ASSESSMENT:  1. 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  2. Mild hypokalemia.  3. Frontal lobe dementia.  4. Diabetes mellitus, type 2.  5. Chronic back pain.  6. Hypothyroidism.  7. History of depression.   PLAN: The patient will be admitted for observation. We want to ensure that there is no recurrence of seizure activity. Neuro checks every 2 hours x4 and then every 4 hours. I discussed the situation with her husband and also the daughter. They are along with the inclination to observation her here in the hospital without starting any antiseizure medication. Their plan upon discharge is [facility] I am in line with this decision. The husband is going to stay in her room to keep an eye on her and also to help care for her. I will correct the hypokalemia and start her on potassium supplementation. Perhaps she needs to stay on potassium supplementation because this happened during the last admission as well. We will monitor her blood sugar. I will continue the rest of her home medications, but discontinue tramadol. She will use either Tylenol or hydrocodone 5 mg instead for pain, p.r.n. For deep vein thrombosis prophylaxis, we will avoid chemical anticoagulation since she is at risk for another seizure and we do not want to be at risk of trauma. We will use TED stockings instead. For peptic ulcer disease  prophylaxis, she will be placed on Protonix 40 mg a day.   TIME NEEDED TO EVALUATE THIS PATIENT: More than 50 minutes.  ____________________________ Carney CornersAmir M. Rudene Rearwish, MD amd:slb D: 08/10/2011 06:15:31 ET T: 08/10/2011 07:37:04 ET JOB#: [id number]  cc: Carney CornersAmir M. Rudene Rearwish, MD, <Dictator> Heidi Dachebecca O. Darliss Cheneyrendorff, MD Zollie ScaleAMIR M Kairo Laubacher MD ELECTRONICALLY SIGNED 08/18/2011 0:21

## 2014-10-05 NOTE — H&P (Signed)
PATIENT NAME:  Krystal Ruiz, LUCK MR#:  [id number] DATE OF BIRTH:  [birth year]  DATE OF ADMISSION:  07/08/2011  PRIMARY CARE PHYSICIAN: Gardiner Rhyme, MD  CHIEF COMPLAINT: Brought in with altered mental status and fever.   HISTORY OF PRESENT ILLNESS: This is a 64 year old female who over the holidays got sick. She has a history of frontal parietal dementia. She had a delirium, not able to sleep. She was in the hospital recently, from 06/24/2011 to 06/30/2011, and was diagnosed with new onset diabetes requiring insulin drip, also had fever at home, and was initially started on vancomycin and ceftriaxone. LP was unable to be done with interventional radiology, it was unsuccessful. The patient's temperature improved, diabetes came under control, and the patient was sent out to rehab. Last night the husband noticed a change in attitude. She was not being nice to anyone, which is unusual for her. This morning her arms were waving in the air, she was delirious, she did not sleep well last night, and she had a fever. She was sent in for further evaluation. In the emergency room, her white count is normal. Urinalysis shows only 1+ leukocyte esterase. Chest x-ray was negative. Temperature is 100.4. Hospitalist services were contacted for further evaluation.   PAST MEDICAL HISTORY:  1. 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 2. History of hypertension, now off medications.  3. Hypothyroidism.  4. Diabetes.  5. Herniated disk in the back. 6. Depression. 7. Acute renal failure on last admission.   PAST SURGICAL HISTORY: Partial hysterectomy.   ALLERGIES: Darvocet.   MEDICATIONS: As per prescription writer, updated by pharmacy tech and include the following. 1. Apidra 8 units three times daily with sliding scale on top of that.  2. Aspirin 81 mg daily.  3. Chondroitin glucosamine with multivitamin and minerals daily.  4. Benazepril 10 mg at bedtime.  5. Glucophage 1000 mg twice a day.  6. Lantus  insulin 70 over units subcutaneous injection at night.  7. Magnesium oxide 400 mg daily.  8. Maxalt 10 mg p.r.n. 9. Namenda 10 mg twice a day. 10. Nystatin powder, one application twice a day.  11. Omega-3 fatty acid 1000 mg daily. 12. Paxil 20 mg daily.  13. Synthroid 25 mcg daily.  14. Tramadol 50 mg every 6 hours p.r.n. 15. Tylenol 650 mg p.r.n.  16. Vitamin D 2000 international units daily.   SOCIAL HISTORY: Currently at [employer]. No smoking. No alcohol. She worked in Designer, fashion/clothing in the past.   FAMILY HISTORY: Mother had diabetes, hypertension, cerebrovascular accident, and dementia. Father died of chronic obstructive pulmonary disease.  REVIEW OF SYSTEMS:  Unable to obtain secondary to frontoparietal dementia.   PHYSICAL EXAMINATION:   VITAL SIGNS: Temperature 100.4, pulse 72, respirations 26, blood pressure 111/67, and pulse oximetry 99%   GENERAL: No respiratory distress. The patient does have periods where she breathes quicker than others.   EYES: Conjunctivae and lids normal. Pupils equal, round, and reactive to light. Not really cooperative with extraocular muscle testing, but able to move eyes left and right.   EARS, NOSE, MOUTH, AND THROAT: Tympanic membranes no erythema. Nasal mucosa no erythema. Throat no erythema. No exudate seen. Lips and gums no lesions.   NECK: No JVD. No bruits. No lymphadenopathy. No thyromegaly. No thyroid nodules palpated. Neck is supple. Negative Kernig sign.   RESPIRATORY: Lungs are clear to auscultation. No use of accessory muscles to breathe. No rhonchi, rales, or wheeze heard.   CARDIOVASCULAR: S1 and S2 normal. No gallops, rubs, or  murmurs heard. Carotid upstroke 2+ bilaterally. No bruits.   EXTREMITIES: Dorsalis pedis pulses 2+ bilaterally. Trace edema of the lower extremities.  ABDOMEN: Soft and nontender. No organomegaly/splenomegaly. Normoactive bowel sounds. No masses felt.   LYMPHATIC: No lymph nodes in the neck.    MUSCULOSKELETAL: Trace edema. No clubbing or cyanosis.   SKIN: No rashes or lesions anteriorly.   NEUROLOGIC: The patient moves all extremities. Difficult to test power, not following commands. Babinski positive bilaterally.   PSYCHIATRIC: Difficult to assess. The patient seems like she does understand and tries to answer questions, but some of the things she says do not make much sense to me and also has some babbling episodes.   LABS/STUDIES: White blood cell count 10.8, hemoglobin and hematocrit 13.0 and 37.9, platelet count 279. Glucose 122, BUN 13, creatinine 1.14, sodium 143, potassium 3.3, chloride 108, CO2 21, calcium 9.3. Troponin negative.   Urinalysis: 1+ leukocyte esterase.   Chest x-ray negative.   EKG: Sinus rhythm at 81 beats per minute, left atrial enlargement, nonspecific ST-T wave changes. QT measured at 464.    ASSESSMENT AND PLAN:  1. Metabolic encephalopathy, fever and delirium: Unclear cause of the fever. We will give empiric Zosyn and watch temperature curve. Blood cultures and urine culture already obtained. Last admission they were unable to get a LP and was empirically put on Rocephin and vancomycin, but that was stopped and the patient had no fever during the rest of the course. It seems less likely meningitis; I am not seeing any clinical signs. Can consider CT scan of the abdomen and pelvis. We will obtain a flu swab. We will get an ultrasound of the lower extremities to rule out deep vein thrombosis for unexplained fever. We will consult Infectious Disease.  2. Hypokalemia: Replace potassium in IV fluids.  3. Frontoparietal dementia: We will avoid Haldol. Continue medications for dementia, Aricept and Namenda.  4. Diabetes: We will continue usual medications at this point and continue to monitor.  5. Hypothyroidism: Continue Synthroid. TSH normal last visit.  6. Depression: Continue Paxil.   CODE STATUS: THE PATIENT IS A FULL CODE.   TIME SPENT ON  ADMISSION: 60 MINUTES.  ____________________________ Herschell Dimesichard J. Renae GlossWieting, MD rjw:slb D: 07/08/2011 15:57:55 ET T: 07/08/2011 16:32:22 ET JOB#: [id number]  cc: Herschell Dimesichard J. Renae GlossWieting, MD, <Dictator> Heidi Dachebecca O. Darliss Cheneyrendorff, MD Salley ScarletICHARD J Danyell Shader MD ELECTRONICALLY SIGNED 07/30/2011 16:37

## 2014-10-05 NOTE — Discharge Summary (Signed)
PATIENT NAME:  Krystal GaulMARTIN, Fleurette MR#:  [id number] DATE OF BIRTH:  Sep 14, 1950  DATE OF ADMISSION:  07/08/2011 DATE OF DISCHARGE:  07/13/2011  DISCHARGE DIAGNOSES:  1. Encephalopathy, fever, and delirium  anemia likely due to viral illness, resolved.  2. Hypokalemia. 3. Frontoparietal dementia. 4. Diabetes. 5. Hypothyroidism. 6. Depression.   DISPOSITION: The patient is being discharged back to her SNF/rehab facility.   DIET: Low sodium, 1800 calorie ADA diet.   ACTIVITY: As tolerated.   DISCHARGE FOLLOWUP: Follow-up with primary care physician, Dr. Darliss Cheneyrendorff, in 1 to 2 weeks after discharge. The patient also has followup with [facility] on 07/18/2011.  DISCHARGE MEDICATIONS:  1. Remeron 50 mg at bedtime.  2. Metformin 500 mg twice a day. 3. Lantus 40 units subcutaneously at bedtime.  4. Aspirin 81 mg daily.  5. Aricept 10 mg at bedtime.  6. Multivitamin daily.  7. Magnesium oxide 400 mg daily.  8. Namenda 10 mg twice a day. 9. Omega-3 fish oil 1000 mg one capsule daily. 10. Paxil 20 mg daily.  11. Synthroid 25 mcg daily. 12. Vitamin D 2000 international units daily. 13. Maxalt 10 mg p.r.n. for migraine headache. 14. Nystatin apply 2 to 3 times a day as needed. 15. Tramadol 50 mg every six hours p.r.n.  16. Tylenol 650 mg every four hours p.r.n.  17. HCTZ/lisinopril 12.5/20 mg one tablet twice a day.  RESULTS: Chest x-ray showed no acute cardiopulmonary pathology.   Lower extremity Doppler showed no evidence of deep vein thrombosis.   Influenza A and B were negative.   Microbiology: C. difficile was negative negative. Blood cultures yielded no growth in 48 hours. Urine culture showed 1000 CFU of gram-positive rods, likely contamination.   CBC is normal. Low potassium, supplemented. Mild hyperglycemia, resolved. Low magnesium, supplemented. Cardiac enzymes negative.   HOSPITAL COURSE: The patient is a 64 year old female with past medical history of frontoparietal  dementia and hypothyroidism who presented with fever, metabolic encephalopathy, and delirium which was felt to be due to viral illness. She had an extensive workup including blood cultures, influenza, and chest x-ray, which were negative. Lower extremity Dopplers were negative for any DVTs. There was no evidence of clinical meningitis. She was initially empirically started on antibiotics, however, since no source of infection was isolated her antibiotics were discontinued. She has remained afebrile since admission. Her white count has been normal. All her vital signs have been normal. She has mild hypokalemia which was supplemented. The patient's diabetes, hypothyroidism, and depression remained well controlled during the hospitalization. The patient has a history of frontoparietal dementia and her condition has been progressively declining. The patient has follow-up with a neurologist on 07/18/2011. The patient is being discharged back to her facility in a stable condition.   TIME SPENT: 45 minutes. ____________________________ Darrick MeigsSangeeta Quavon Keisling, MD sp:slb D: 07/13/2011 11:09:41 ET     T: 07/13/2011 11:22:34 ET       JOB#: [id number] cc: Darrick MeigsSangeeta Tykel Badie, MD, <Dictator> Heidi Dachebecca O. Darliss Cheneyrendorff, MD Darrick MeigsSANGEETA Jahfari Ambers MD ELECTRONICALLY SIGNED 07/13/2011 19:41

## 2014-10-05 NOTE — H&P (Signed)
PATIENT NAME:  Krystal Ruiz, Krystal Ruiz MR#:  [id number] DATE OF BIRTH:  Nov 20, 1950  DATE OF ADMISSION:  06/24/2011  PRIMARY CARE PHYSICIAN: Dr. Gardiner Rhyme in [place].    CHIEF COMPLAINT: Patient has been falling a lot for last one week. Also complaining of excessive polyuria. The history was mainly obtained from the patient and the daughter. Patient cannot provide any history because of her dementia.   HISTORY OF PRESENT ILLNESS: A 64 year old female who has frontotemporal dementia, also history of hypertension, hypothyroidism. She had no prior history of diabetes, no heart problems in the past. She was brought in by family because apparently for the last one week she has been falling a lot. She fell about a week ago. She hit her buttock area. She fell again last night. She was difficult to get out of the bed. She has been more lethargic and more weak. They said that when she feel last night she bumped her head. Also as per the triage note they were also complaining of some neck pain. As per the husband there has been excessive urination for last one week they noticed since Sunday. She usually drinks Crystal Light and she says "juice" so they are also saying questionable increased thirst also for last one week. When EMS came they noted that she had a sugar of 549. Also the patient has low-grade fever but no fever has been documented in the triage note. There was no cough. No shortness of breath. She has no nausea, vomiting. No diarrhea. Patient has severe dementia. She is unable to express herself. The husband usually takes care of her for 24 hours. She needs assistance with bathing, for feeding. She is on a regular diet. In the Emergency Room she was noted to have a sugar of 516, she had a bicarbonate of 21, she had a gap of 16 and serum osmolality of 315 so she was severely dehydrated. Her urinalysis showed 2+ ketones and greater than 500 glucose. She was fond to have a creatinine of 1.48 so she has new  onset diabetes with hyperosmolar hyperglycemic state, nonketotic. She got about 2 liters of normal saline bolus in the Emergency Room. She also got a dose of Zosyn and 2 grams of ceftriaxone in the Emergency Room because of suspicion for meningitis because of neck stiffness. An LP was attempted by the ER physician but could not be done so the patient was sent for fluoroscopy. LP was attempted by fluoroscopy but they also could not obtain the CSF. The patient is a very poor historian. She is minimally verbal. She would only speak in monosyllables and she is only demanding juice or fluid because she is extremely dry and dehydrated at this time. She denies any pain from any site. The patient is looking in a bright room. There is no evidence of any photophobia. Also as per the family she was having some bizarre behavior, also she was chattering continuously at times . I think she has some baseline stiffness also. She is being admitted for new onset diabetes, hyperosmolar nonketotic state, severe dehydration, acute renal failure secondary to dehydration and also questionable neck stiffness with suspicion for meningitis.  REVIEW OF SYSTEMS: Her review of systems is not possible other than what is provided in the history of present illness. She is an extremely poor historian.   PAST MEDICAL HISTORY: As per the family she has history of: 1. Frontotemporal dementia which has progressed. She follows with neurology at [facility]. She needs assistance with activities of  daily living. She usually needs assistance for ambulation also. She is on a regular diet.  2. Hypertension.  3. Hypothyroidism.   PAST SURGICAL HISTORY: She had a partial hysterectomy.   ALLERGIES: Darvocet-N.   HOME MEDICATIONS: Home medications which were reviewed by the pharmacy tech:  1. Aspirin 81 mg daily.  2. Boost once a day.  3. Cholecalciferol 1000 units 2 capsules once a day.  4. Donepezil 10 mg at bedtime. 5. Glucosamine once a  day. 6. Lisinopril/hydrochlorothiazide 20/12.5 b.i.d.  7. Levothyroxine 25 mcg daily.  8. Memantine 10 mg b.i.d.  9. Nystatin topically as needed. 10. Omega-3 polyunsaturated fatty acid every other day. 11. Paroxetine 20 mg daily.  12. Rizatriptan p.r.n. for headache. 13. Tramadol every six hours p.r.n. which she is not taking anymore.   SOCIAL HISTORY: She lives with her husband. She also has a daughter. Husband usually actually takes care of her 24 hours. She also has some home health which I think they come for 60 hours a week. No smoking or alcohol use.   FAMILY HISTORY: Her mother had history of diabetes and hypertension.   PHYSICAL EXAMINATION:  VITAL SIGNS: Her vitals when she presented to the Emergency Room: Heart rate 88, respiratory rate 22, blood pressure 127/89, saturating 97% on room air. No fever has been documented.   GENERAL: This is a middle-aged obese Caucasian female. She is lethargic but she is awake, severely dehydrated.   HEENT: Bilateral pupils are equal. Extraocular muscles are intact. No scleral icterus. No conjunctivitis. Oral mucosa is completely dry. No pallor.   NECK: No thyroid tenderness, enlargement or nodule. No masses, nontender. No adenopathy. No JVD. No carotid bruit. There is some neck stiffness but we do not know whether this is baseline or this is new.    CHEST: Bilateral breath sounds are clear. No wheeze. Normal effort. No respiratory distress.   HEART: Heart sounds are regular. No murmur. Good peripheral pulses. No lower extremity edema.   ABDOMEN: Abdomen appears to be soft, nontender. Normal bowel sounds. No hepatosplenomegaly. No bruits. No masses.   RECTAL: Deferred.   NEUROLOGIC: She is awake. She is lethargic. She apparently has no photophobia. She is looking at bright light. She is minimally verbal, only actually speaks seldom, only monosyllables. She has some diffuse stiffness. I think it is because of frontotemporal dementia. She is  able to move her bilateral upper and lower extremities slightly against gravity.   EXTREMITIES: No cyanosis. No clubbing.   SKIN: No rash. No lesions.   LABORATORY, DIAGNOSTIC AND RADIOLOGICAL DATA: White count 11.1, hemoglobin 14.9, platelet count 301,000. BMP: Sodium 143, potassium 3.2, BUN 32, creatinine 1.48, glucose 516, bicarbonate 21. She has a serum osmolality of 315 and her GFR is 38, gap of 16. CK 289. Troponin is negative. Her urinalysis shows 2+ ketones, and glucose greater than 500, protein negative. Influenza A and B have been negative. Venous gas: pH 7.44, pCO2 32. Her CT of the head is negative. Changes of atrophy and chronic small vessel ischemic disease. Her CT of the cervical spine is basically negative. Her urine culture and blood cultures are pending.   IMPRESSION:  1. New onset diabetes, hyperglycemic, nonketotic state.  2. Acute renal failure secondary to severe dehydration. 3. Leukocytosis, appear to be more related to dehydration. 4. Neck stiffness, low suspicion for meningitis. 5. Advanced frontotemporal dementia. 6. Hypertension. 7. Hypothyroidism.   PLAN: A 64 year old female who has history of advanced left frontotemporal dementia, hypertension, hypothyroidism. She presents  with frequent falls for about one week, polyuria and polydipsia. She was found to have a sugar of 549. She is dehydrated, hyperosmolar with a serum osmolality of 315. Her gap is normal, nonketotic. She is dehydrated with renal failure. This is new onset diabetes. I am going to check an hemoglobin A1c on her. Start her on insulin drip, non-DKA protocol. She got 2 liters of normal saline bolus already and I am going to start her with half-normal saline at 150 hour. She is extremely dry at this time. Urinalysis shows 2+ ketones and glucosuria. Her leukocytosis is most likely secondary to dehydration and hyperosmolar state. Her urinalysis is negative for any pyuria. Blood cultures are pending. She has  some diffuse stiffness. I think it is because of dementia. She had some neck stiffness but there is no photophobia. I have very low suspicion for meningitis but considering that we are unable to do an LP; LP was attempted the ER physician, unable to get it then the fluoroscopy guided LP was attempted and they could not obtain it so I am going to continue on vancomycin and ceftriaxone but not give any steroids at this time with low suspicion of meningitis and new onset diabetes. Plan was discussed with the patient's family in detail. Patient also has frequent falls; will call a physical therapy consult on her. Will obtain a care management consult also because she needs some services at the time of discharge. Her family members have to be given diabetic teaching. Will put in a consult for inpatient diabetes referral. She may need to be discharging on both oral hypoglycemic and insulin if her hemoglobin A1c is high.  TIME SPENT WITH ADMISSION AND COORDINATION: 60 minutes.  ____________________________ Fredia Sorrow, MD ag:cms D: 06/24/2011 16:11:22 ET T: 06/24/2011 16:48:54 ET  JOB#: [id number] cc: Heidi Dach. Darliss Cheney, MD Fredia Sorrow MD ELECTRONICALLY SIGNED 07/08/2011 11:00

## 2014-10-05 NOTE — Discharge Summary (Signed)
PATIENT NAME:  Krystal Ruiz, Krystal Ruiz MR#:  [id number] DATE OF BIRTH:  01-Aug-1950  DATE OF ADMISSION:  08/10/2011 DATE OF DISCHARGE:  08/11/2011  PRESENTING COMPLAINT: Suspected seizures.   DISCHARGE DIAGNOSES:  1. Suspected seizures witnessed by husband and EMTs.  2. Hypokalemia.  3. Frontoparietal dementia.  4. Type 2 diabetes.  5. Hypothyroidism.  6. Depression.   CONDITION ON DISCHARGE: Fair. Vitals stable.   MEDICATIONS:  1. Aspirin 81 mg p.o. daily.  2. Donepezil 10 mg p.o. daily at bedtime.  3. Magnesium oxide 400 mg p.o. daily.  4. Omega-3 polyunsaturated fatty acids 1000 mg 1 capsule every other day.  5. Paxil 20 mg daily.  6. Synthroid 25 mcg p.o. daily.  7. Vitamin D3, two tablets p.o. daily at 9:00.  8. Nystatin one application topically 2 to 3 times a day.  9. Tramadol 50 mg 1 tablet every six hours as needed.  10. Hydrochlorothiazide/lisinopril 12.5/20, one tablet b.i.d.  11. Metformin 500 mg b.i.d.  12. Lantus 40 units subcutaneous at bedtime.  13. Namenda 10 mg b.i.d.  14. Maxalt 10 mg p.o. daily.  15. Glucosamine/chondroitin sulfate 1 tablet 3 times a day.  16. Oxybutynin 5 mg p.o. daily.  17. Remeron 7.5 mg daily.   DIET: Low sodium, ADA 1,800 calorie diet.   FOLLOWUP: [facility], March 04 at 1:00 p.m. with [facility] Small[facility]ace [facility].  LABORATORY, RADIOLOGICAL AND DIAGNOSTIC DATA: Chest x-ray shows no acute cardiopulmonary abnormality. CT of the head shows chronic findings within the brain which appear stable. Overall, there has not been significant interval change since earlier study. CBC within normal limits. Basic comprehensive metabolic panel within normal limits except potassium of 3.2. Troponin less than 0.02. EKG shows normal sinus rhythm.   BRIEF SUMMARY OF HOSPITAL COURSE: Ms. Krystal Ruiz is a 64 year old Caucasian female well known to our service from previous admissions who comes in with:  1. Suspected seizures which was  witnessed by husband who is sole caregiver and by EMTs. The patient did not have any seizures while here in house and no antiseizure medications were initiated. She is back at her baseline. I did discuss with [facility]rystal [facility]ace [facility] and she advised me not to start any medications at present. The patient will follow up with her on the above appointment. CT of the head remained negative.  2. Hypokalemia. Replaced with p.o. potassium.  3. Chronic frontoparietal dementia.  4. Type 2 diabetes, on Lantus p.o. medications.  5. Hypothyroidism. Continued Synthroid.   Hospital stay otherwise remained stable. Discharge plan was discussed with the patient's husband. The patient remained a FULL CODE.   TIME SPENT: 40 minutes.   ____________________________ Krystal HailSona A. Allena KatzPatel, MD sap:ap D: 08/11/2011 15:41:12 ET T: 08/12/2011 11:55:46 ET JOB#: [id number]  cc: Krystal Ruiz A. Allena KatzPatel, MD, <Dictator> Krystal Smallandace Boyette, NP, [facility] Krystal OraSONA A Alecsander Hattabaugh MD ELECTRONICALLY SIGNED 08/17/2011 6:25
